# Patient Record
Sex: Female | Born: 1970
Health system: Southern US, Community
[De-identification: ages and names within clinical notes are randomized; demographics above are authoritative.]

## PROBLEM LIST (undated history)

## (undated) DIAGNOSIS — I10 Essential (primary) hypertension: Secondary | ICD-10-CM

## (undated) DIAGNOSIS — M549 Dorsalgia, unspecified: Secondary | ICD-10-CM

## (undated) DIAGNOSIS — E781 Pure hyperglyceridemia: Secondary | ICD-10-CM

## (undated) DIAGNOSIS — J45909 Unspecified asthma, uncomplicated: Secondary | ICD-10-CM

## (undated) HISTORY — PX: SPINAL CORD STIMULATOR IMPLANT: SHX2422

## (undated) HISTORY — PX: ABDOMINAL HYSTERECTOMY: SHX81

## (undated) HISTORY — PX: TMJ ARTHROPLASTY: SHX1066

---

## 1999-08-12 ENCOUNTER — Ambulatory Visit (HOSPITAL_COMMUNITY): Admission: RE | Admit: 1999-08-12 | Discharge: 1999-08-12 | Payer: Self-pay | Admitting: Orthopedic Surgery

## 1999-08-12 ENCOUNTER — Encounter: Payer: Self-pay | Admitting: Orthopedic Surgery

## 1999-09-06 ENCOUNTER — Ambulatory Visit (HOSPITAL_BASED_OUTPATIENT_CLINIC_OR_DEPARTMENT_OTHER): Admission: RE | Admit: 1999-09-06 | Discharge: 1999-09-06 | Payer: Self-pay | Admitting: Orthopedic Surgery

## 2018-07-09 ENCOUNTER — Emergency Department (HOSPITAL_BASED_OUTPATIENT_CLINIC_OR_DEPARTMENT_OTHER): Payer: Managed Care, Other (non HMO)

## 2018-07-09 ENCOUNTER — Other Ambulatory Visit: Payer: Self-pay

## 2018-07-09 ENCOUNTER — Emergency Department (HOSPITAL_BASED_OUTPATIENT_CLINIC_OR_DEPARTMENT_OTHER)
Admission: EM | Admit: 2018-07-09 | Discharge: 2018-07-09 | Disposition: A | Discharge status: Home / Self Care | Payer: Managed Care, Other (non HMO) | Attending: Emergency Medicine | Admitting: Emergency Medicine

## 2018-07-09 ENCOUNTER — Encounter (HOSPITAL_BASED_OUTPATIENT_CLINIC_OR_DEPARTMENT_OTHER): Payer: Self-pay | Admitting: *Deleted

## 2018-07-09 DIAGNOSIS — Z79899 Other long term (current) drug therapy: Secondary | ICD-10-CM | POA: Diagnosis not present

## 2018-07-09 DIAGNOSIS — N23 Unspecified renal colic: Secondary | ICD-10-CM | POA: Diagnosis not present

## 2018-07-09 DIAGNOSIS — J45909 Unspecified asthma, uncomplicated: Secondary | ICD-10-CM | POA: Diagnosis not present

## 2018-07-09 DIAGNOSIS — R1032 Left lower quadrant pain: Secondary | ICD-10-CM | POA: Diagnosis present

## 2018-07-09 DIAGNOSIS — I10 Essential (primary) hypertension: Secondary | ICD-10-CM | POA: Insufficient documentation

## 2018-07-09 HISTORY — DX: Dorsalgia, unspecified: M54.9

## 2018-07-09 HISTORY — DX: Essential (primary) hypertension: I10

## 2018-07-09 HISTORY — DX: Unspecified asthma, uncomplicated: J45.909

## 2018-07-09 HISTORY — DX: Pure hyperglyceridemia: E78.1

## 2018-07-09 LAB — COMPREHENSIVE METABOLIC PANEL
ALT: 52 U/L — AB (ref 0–44)
AST: 30 U/L (ref 15–41)
Albumin: 4.5 g/dL (ref 3.5–5.0)
Alkaline Phosphatase: 29 U/L — ABNORMAL LOW (ref 38–126)
Anion gap: 12 (ref 5–15)
BILIRUBIN TOTAL: 0.6 mg/dL (ref 0.3–1.2)
BUN: 16 mg/dL (ref 6–20)
CO2: 26 mmol/L (ref 22–32)
CREATININE: 0.89 mg/dL (ref 0.44–1.00)
Calcium: 9.7 mg/dL (ref 8.9–10.3)
Chloride: 99 mmol/L (ref 98–111)
GFR calc Af Amer: >60 mL/min (ref >60)
Glucose, Bld: 91 mg/dL (ref 70–99)
Potassium: 3.6 mmol/L (ref 3.5–5.1)
Sodium: 137 mmol/L (ref 135–145)
TOTAL PROTEIN: 7.7 g/dL (ref 6.5–8.1)

## 2018-07-09 LAB — URINALYSIS, ROUTINE W REFLEX MICROSCOPIC
Bilirubin Urine: NEGATIVE
Glucose, UA: NEGATIVE mg/dL
Hgb urine dipstick: NEGATIVE
Ketones, ur: NEGATIVE mg/dL
Leukocytes, UA: NEGATIVE
Nitrite: NEGATIVE
Protein, ur: NEGATIVE mg/dL
Specific Gravity, Urine: 1.01 (ref 1.005–1.030)
pH: 8 (ref 5.0–8.0)

## 2018-07-09 LAB — CBC WITH DIFFERENTIAL/PLATELET
Abs Immature Granulocytes: 0.02 10*3/uL (ref 0.00–0.07)
Basophils Absolute: 0 10*3/uL (ref 0.0–0.1)
Basophils Relative: 0 %
EOS ABS: 0.1 10*3/uL (ref 0.0–0.5)
Eosinophils Relative: 1 %
HEMATOCRIT: 40 % (ref 36.0–46.0)
Hemoglobin: 13.5 g/dL (ref 12.0–15.0)
IMMATURE GRANULOCYTES: 0 %
LYMPHS ABS: 2.6 10*3/uL (ref 0.7–4.0)
Lymphocytes Relative: 28 %
MCH: 30.2 pg (ref 26.0–34.0)
MCHC: 33.8 g/dL (ref 30.0–36.0)
MCV: 89.5 fL (ref 80.0–100.0)
MONOS PCT: 7 %
Monocytes Absolute: 0.6 10*3/uL (ref 0.1–1.0)
NEUTROS ABS: 6.2 10*3/uL (ref 1.7–7.7)
NEUTROS PCT: 64 %
Platelets: 340 10*3/uL (ref 150–400)
RBC: 4.47 MIL/uL (ref 3.87–5.11)
RDW: 12.3 % (ref 11.5–15.5)
WBC: 9.6 10*3/uL (ref 4.0–10.5)
nRBC: 0 % (ref 0.0–0.2)

## 2018-07-09 LAB — LIPASE, BLOOD: LIPASE: 29 U/L (ref 11–51)

## 2018-07-09 MED ORDER — TAMSULOSIN HCL 0.4 MG PO CAPS: 0.4000 mg | ORAL_CAPSULE | Freq: Every day | ORAL | 0 refills | Status: AC

## 2018-07-09 MED ORDER — KETOROLAC TROMETHAMINE 15 MG/ML IJ SOLN
15.0000 mg | Freq: Once | INTRAMUSCULAR | Status: AC
  Administered 2018-07-09: 15 mg via INTRAVENOUS
  Filled 2018-07-09: qty 1

## 2018-07-09 MED ORDER — NAPROXEN 500 MG PO TABS: 500.0000 mg | ORAL_TABLET | Freq: Two times a day (BID) | ORAL | 0 refills | Status: AC

## 2018-07-09 MED ORDER — ONDANSETRON 4 MG PO TBDP: 4.0000 mg | ORAL_TABLET | Freq: Three times a day (TID) | ORAL | 0 refills | Status: AC | PRN

## 2018-07-09 MED FILL — ONDANSETRON ODT 4 MG TABLET: 4 | 3 days supply | Qty: 10 | Fill #0

## 2018-07-09 MED FILL — TAMSULOSIN HCL 0.4 MG CAP: 0.4 | 7 days supply | Qty: 7 | Fill #0

## 2018-07-09 MED FILL — NAPROXEN 500 MG TABLET: 500 | 10 days supply | Qty: 20 | Fill #0

## 2018-07-09 NOTE — Discharge Instructions (Signed)
Please read and follow all provided instructions.  Your diagnoses today include:  1. Ureteral colic     Tests performed today include:  Urine test that no blood in your urine and no infection  CT scan which showed a 4.5 millimeter kidney on the left side side  Blood test that showed normal kidney function  Vital signs. See below for your results today.   Medications prescribed:   Flomax (tamsulosin) - relaxes smooth muscle to help kidney stones pass   Naproxen - anti-inflammatory pain medication  Do not exceed 500mg  naproxen every 12 hours, take with food  You have been prescribed an anti-inflammatory medication or NSAID. Take with food. Take smallest effective dose for the shortest duration needed for your pain. Stop taking if you experience stomach pain or vomiting.    Zofran (ondansetron) - for nausea and vomiting  Take any prescribed medications only as directed.  Home care instructions:  Follow any educational materials contained in this packet.  Please double your fluid intake for the next several days. Strain your urine and save any stones that may pass.   BE VERY CAREFUL not to take multiple medicines containing Tylenol (also called acetaminophen). Doing so can lead to an overdose which can damage your liver and cause liver failure and possibly death.   Follow-up instructions: Please follow-up with your urologist or the urologist referral (provided on front page) in the next 1 week for further evaluation of your symptoms.  Return instructions:  If you need to return to the Emergency Department, go to [facility] and not [facility]. The urologists are located at [facility] and can better care for you at this location.   Please return to the Emergency Department if you experience worsening symptoms.  Please return if you develop fever or uncontrolled pain or vomiting.  Please return if you have any other emergent concerns.  Additional  Information:  Your vital signs today were: BP (!) 134/100 (BP Location: Right Arm)    Pulse 94    Temp 98.7 F (37.1 C) (Oral)    Resp 16    Ht 5\' 5"  (1.651 m)    Wt 83.9 kg    SpO2 96%    BMI 30.79 kg/m  If your blood pressure (BP) was elevated above 135/85 this visit, please have this repeated by your doctor within one month. --------------

## 2018-07-09 NOTE — ED Triage Notes (Signed)
Back pain off and on x 2 days. She was seen 2 days ago and her urine was negative but she was started on Keflex. She had visible blood in her urine 4 days ago without pain.

## 2018-07-09 NOTE — ED Provider Notes (Signed)
[facility] EMERGENCY DEPARTMENT Provider Note   CSN: [id number] Arrival date & time: 07/09/18  1252     History   Chief Complaint No chief complaint on file.   HPI Susan Good is a 47 y.o. female.  Patient with history of ankylosing spondylitis on Enbrel, previous spinal cord stimulator implantation, on chronic morphine -- presents to the emergency department with left sided back pain, flank pain, and abdominal pain.  Symptoms started 3 to 4 days ago.  She saw her primary care physician who ordered a UA which was negative however she was given antibiotics to cover for clinical urinary tract infection.  Symptoms did seem to improve however last night they became much worse.  Pain is severe at its worst, waxing and waning.  It does not radiate.  Patient has had associated nausea and vomiting.  No constipation, diarrhea, hematochezia or melena.  No vaginal bleeding or discharge.  She denies dysuria, persistent hematuria, increased frequency urgency.  She states that she did see some blood in her urine 4 nights ago.  She has a history of diverticulitis however this feels different.  She also states that she has had several surgeries in the past for ovarian cysts.  She thinks that she had her appendix removed during 1 of the surgeries but is uncertain.  She has had a hysterectomy.  Home medications are not helping her pain.     Past Medical History:  Diagnosis Date  . Asthma   . Back pain   . High blood triglycerides   . Hypertension     There are no active problems to display for this patient.   Past Surgical History:  Procedure Laterality Date  . ABDOMINAL HYSTERECTOMY    . CESAREAN SECTION    . SPINAL CORD STIMULATOR IMPLANT    . TMJ ARTHROPLASTY       OB History   None      Home Medications    Prior to Admission medications   Medication Sig Start Date End Date Taking? Authorizing Provider  ALBUTEROL IN Inhale into the lungs.   Yes [provider]    co-enzyme Q-10 30 MG capsule Take 30 mg by mouth 3 (three) times daily.   Yes [provider]  cyclobenzaprine (FLEXERIL) 5 MG tablet Take 5 mg by mouth 3 (three) times daily as needed for muscle spasms.   Yes [provider]  Etanercept (ENBREL MINI) 50 MG/ML SOCT Inject into the skin.   Yes [provider]  fenofibrate 160 MG tablet Take 160 mg by mouth daily.   Yes [provider]  Fluticasone Furoate-Vilanterol (BREO ELLIPTA IN) Inhale into the lungs.   Yes [provider]  lisinopril-hydrochlorothiazide (PRINZIDE,ZESTORETIC) 20-12.5 MG tablet Take 1 tablet by mouth daily.   Yes [provider]  montelukast (SINGULAIR) 10 MG tablet Take 10 mg by mouth at bedtime.   Yes [provider]  morphine (MSIR) 15 MG tablet Take 15 mg by mouth every 4 (four) hours as needed for severe pain.   Yes [provider]  naloxone (NARCAN) nasal spray 4 mg/0.1 mL Place 1 spray into the nose.   Yes [provider]  omeprazole (PRILOSEC) 20 MG capsule Take 20 mg by mouth daily.   Yes [provider]  Specialty Vitamins Products (MAGNESIUM, AMINO ACID CHELATE,) 133 MG tablet Take 1 tablet by mouth 2 (two) times daily.   Yes [provider]  SUMAtriptan (IMITREX) 100 MG tablet Take 100 mg by  mouth every 2 (two) hours as needed for migraine. May repeat in 2 hours if headache persists or recurs.   Yes [provider]  verapamil (VERELAN PM) 180 MG 24 hr capsule Take 180 mg by mouth at bedtime.   Yes [provider]    Family History No family history on file.  Social History Social History   Tobacco Use  . Smoking status: Never Smoker  . Smokeless tobacco: Never Used  Substance Use Topics  . Alcohol use: Never    Frequency: Never  . Drug use: Never     Allergies   Baclofen; Codeine; Demerol [meperidine hcl]; Gabapentin; Ivp dye [iodinated diagnostic agents]; Lyrica [pregabalin];  Tizanidine hcl; and Tramadol   Review of Systems Review of Systems  Constitutional: Negative for fever.  HENT: Negative for rhinorrhea and sore throat.   Eyes: Negative for redness.  Respiratory: Negative for cough.   Cardiovascular: Negative for chest pain.  Gastrointestinal: Positive for abdominal pain. Negative for diarrhea, nausea and vomiting.  Genitourinary: Positive for flank pain. Negative for dysuria, hematuria, vaginal bleeding and vaginal discharge.  Musculoskeletal: Positive for back pain. Negative for myalgias.  Skin: Negative for rash.  Neurological: Negative for headaches.     Physical Exam Updated Vital Signs BP (!) 147/102   Pulse (!) 102   Temp 98.6 F (37 C) (Oral)   Resp 20   Ht 5\' 5"  (1.651 m)   Wt 83.9 kg   SpO2 100%   BMI 30.79 kg/m   Physical Exam  Constitutional: She appears well-developed and well-nourished.  HENT:  Head: Normocephalic and atraumatic.  Mouth/Throat: Oropharynx is clear and moist.  Eyes: Conjunctivae are normal. Right eye exhibits no discharge. Left eye exhibits no discharge.  Neck: Normal range of motion. Neck supple.  Cardiovascular: Normal rate, regular rhythm and normal heart sounds.  No murmur heard. Pulmonary/Chest: Effort normal and breath sounds normal.  Abdominal: Soft. There is tenderness (Mid left abdominal tenderness, mild to moderate). There is no rebound and no guarding.  Musculoskeletal: She exhibits tenderness. She exhibits no edema.  Tenderness to the left thoracic back and flank area.  Neurological: She is alert.  Skin: Skin is warm and dry.  Psychiatric: She has a normal mood and affect.  Nursing note and vitals reviewed.    ED Treatments / Results  Labs (all labs ordered are listed, but only abnormal results are displayed) Labs Reviewed  COMPREHENSIVE METABOLIC PANEL - Abnormal; Notable for the following components:      Result Value   ALT 52 (*)    Alkaline Phosphatase 29 (*)    All other  components within normal limits  URINALYSIS, ROUTINE W REFLEX MICROSCOPIC  CBC WITH DIFFERENTIAL/PLATELET  LIPASE, BLOOD    EKG None  Radiology Ct Renal Stone Study  Result Date: 07/09/2018 CLINICAL DATA:  Acute left flank pain, gross hematuria. EXAM: CT ABDOMEN AND PELVIS WITHOUT CONTRAST TECHNIQUE: Multidetector CT imaging of the abdomen and pelvis was performed following the standard protocol without IV contrast. COMPARISON:  None. FINDINGS: Lower chest: No acute abnormality. Hepatobiliary: Fatty infiltration of the liver is noted. No gallstones or biliary dilatation is noted. Pancreas: Unremarkable. No pancreatic ductal dilatation or surrounding inflammatory changes. Spleen: Normal in size without focal abnormality. Adrenals/Urinary Tract: Adrenal glands appear normal. Right kidney and ureter are unremarkable. Nonobstructive left renal calculus is noted. Mild left hydronephrosis is noted secondary to 5 mm calculus just distal to ureteropelvic junction. Urinary bladder is unremarkable. Stomach/Bowel: Stomach is within normal limits.  Appendix appears normal. No evidence of bowel wall thickening, distention, or inflammatory changes. Mild sigmoid diverticulosis is noted. Vascular/Lymphatic: No significant vascular findings are present. No enlarged abdominal or pelvic lymph nodes. Reproductive: Status post hysterectomy. No adnexal masses. Other: No abdominal wall hernia or abnormality. No abdominopelvic ascites. Musculoskeletal: No acute or significant osseous findings. IMPRESSION: Mild left hydronephrosis is noted secondary to 5 mm calculus just distal to ureteropelvic junction. Small nonobstructive left renal calculus is noted as well. Fatty infiltration of the liver. Sigmoid diverticulosis without inflammation. Electronically Signed   By: Lupita Raider, M.D.   On: 07/09/2018 14:42    Procedures Procedures (including critical care time)  Medications Ordered in ED Medications  ketorolac  (TORADOL) 15 MG/ML injection 15 mg (15 mg Intravenous Given 07/09/18 1459)     Initial Impression / Assessment and Plan / ED Course  I have reviewed the triage vital signs and the nursing notes.  Pertinent labs & imaging results that were available during my care of the patient were reviewed by me and considered in my medical decision making (see chart for details).     Patient seen and examined. Work-up initiated. Medications declined.   Vital signs reviewed and are as follows: BP (!) 147/102   Pulse (!) 102   Temp 98.6 F (37 C) (Oral)   Resp 20   Ht 5\' 5"  (1.651 m)   Wt 83.9 kg   SpO2 100%   BMI 30.79 kg/m   3:23 PM patient updated on results.  She is comfortable with the current time.  She has received IV Toradol.  Patient counseled on kidney stone treatment. Urged patient to strain urine and save any stones. Urged urology follow-up and return to [facility] with any complications. Counseled patient to maintain good fluid intake.   Counseled patient on use of Flomax and NSAIDs.   Patient counseled on use of narcotic pain medications. She will continue usual home meds. Urged not to drink alcohol, drive, or perform any other activities that requires focus while taking these medications. The patient verbalizes understanding and agrees with the plan.    Final Clinical Impressions(s) / ED Diagnoses   Final diagnoses:  Ureteral colic   Patient with left-sided renal colic.  Stone has not progressed very far as of yet.  Urine without signs of infection.  Normal kidney function.  Pain is controlled.  Home with continued conservative measures, her home chronic pain medication, and urology follow-up.  Return instructions as above.  ED Discharge Orders         Ordered    tamsulosin (FLOMAX) 0.4 MG CAPS capsule  Daily     07/09/18 1520    naproxen (NAPROSYN) 500 MG tablet  2 times daily     07/09/18 1520    ondansetron (ZOFRAN ODT) 4 MG disintegrating tablet  Every 8 hours PRN      07/09/18 1521           Renne Crigler, PA-C 07/09/18 1525    Alvira Monday, MD 07/10/18 2056

## 2019-09-11 IMAGING — CT CT RENAL STONE PROTOCOL
2 of 4 series · 16 of 46 positions shown, 18 images · non-contrast
Comparison: None.

CLINICAL DATA: Acute left flank pain, gross hematuria.

EXAM:
CT ABDOMEN AND PELVIS WITHOUT CONTRAST
TECHNIQUE: Multidetector CT imaging of the abdomen and pelvis was performed
following the standard protocol without IV contrast.

[Series 2: axial st · axial · 0.98mm/px · z∈[-451,+14]mm · 13 of 105 slices shown, 15 images]
[im 8/105  soft-tissue]
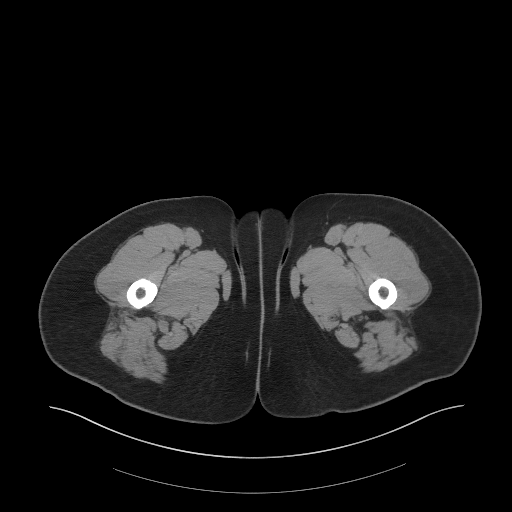
[im 8/105  bone]
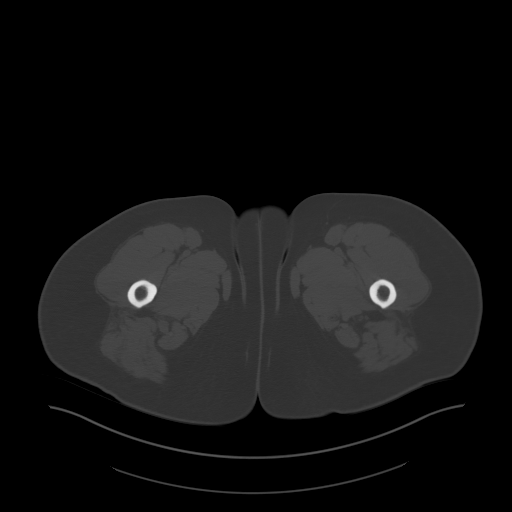
[im 16/105  soft-tissue]
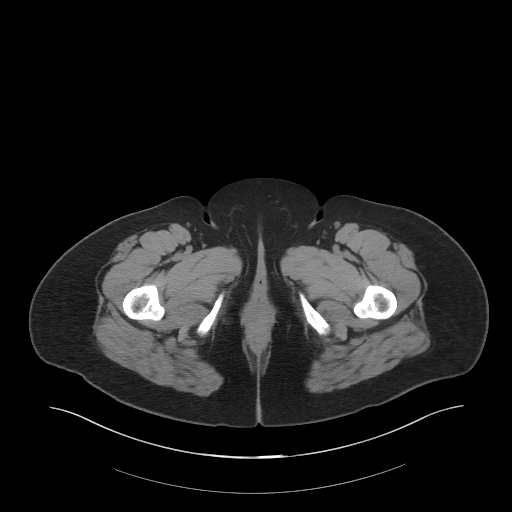
[im 24/105  soft-tissue]
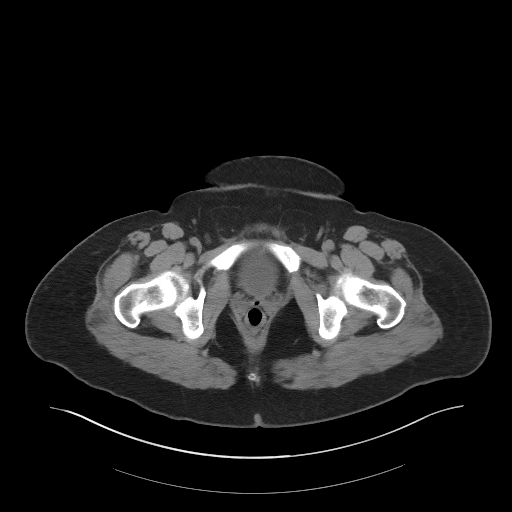
[im 31/105  soft-tissue]
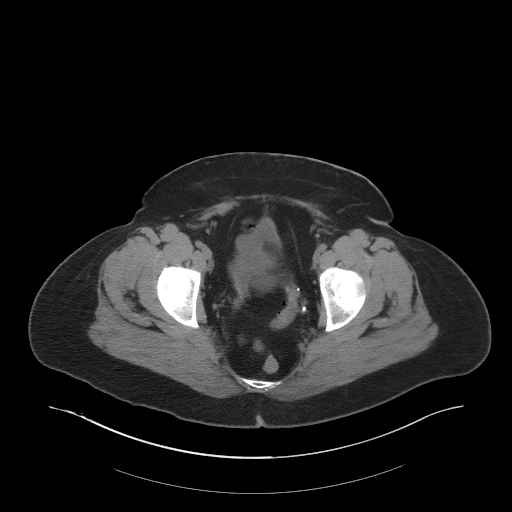
[im 39/105  soft-tissue]
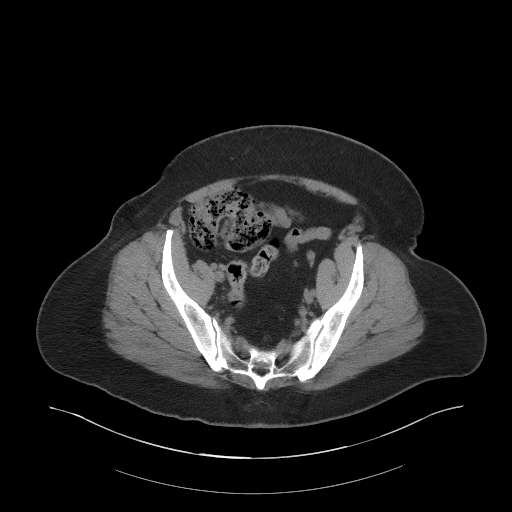
[im 47/105  soft-tissue]
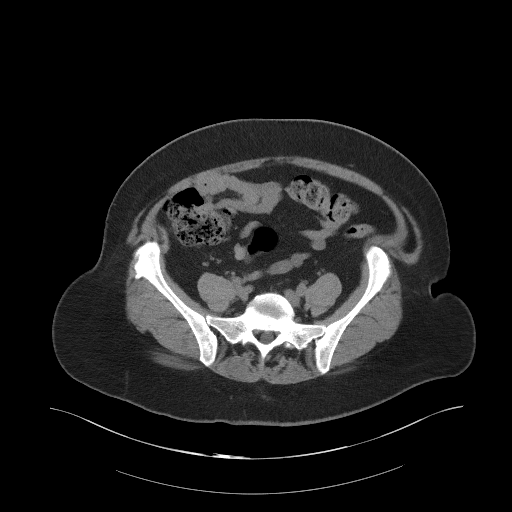
[im 54/105  soft-tissue]
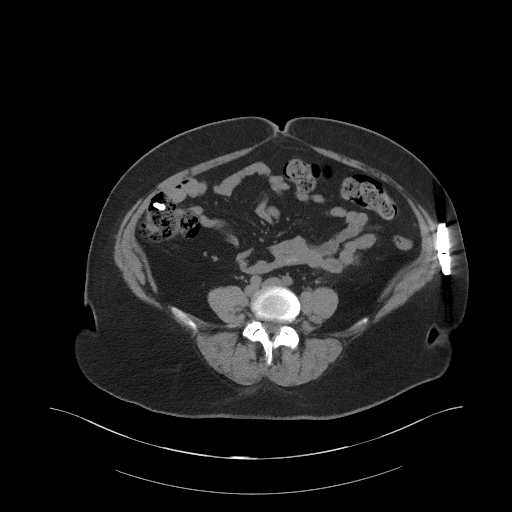
[im 62/105  soft-tissue]
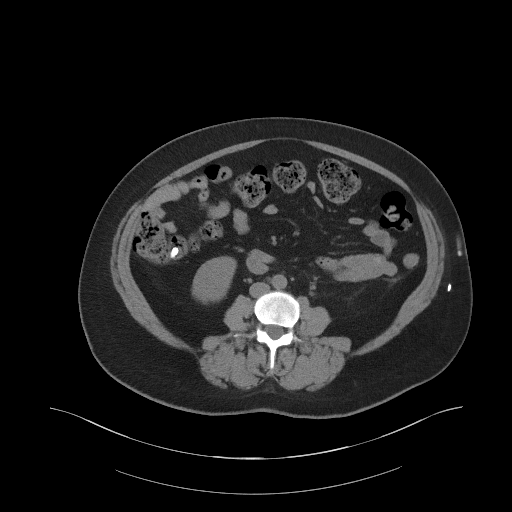
[im 70/105  soft-tissue]
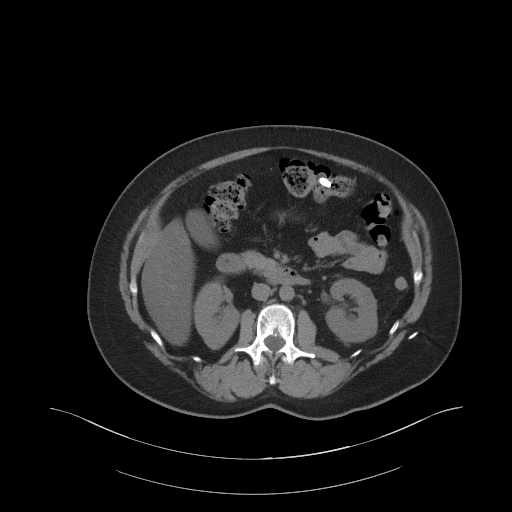
[im 70/105  bone]
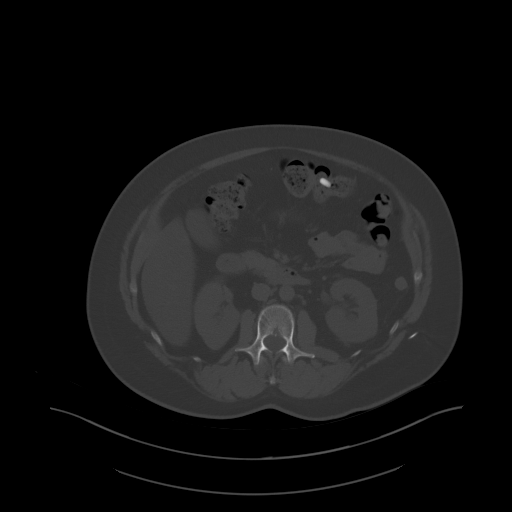
[im 78/105  soft-tissue]
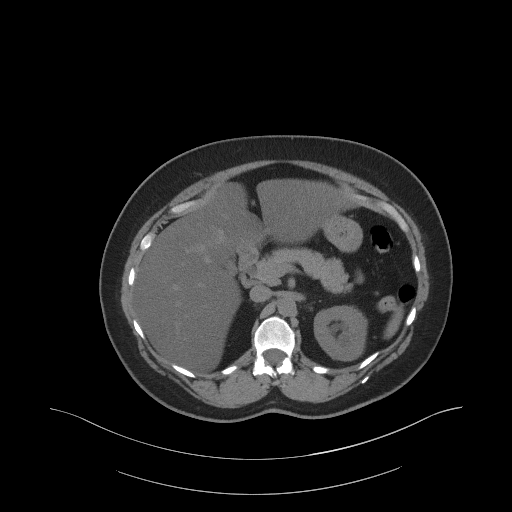
[im 85/105  soft-tissue]
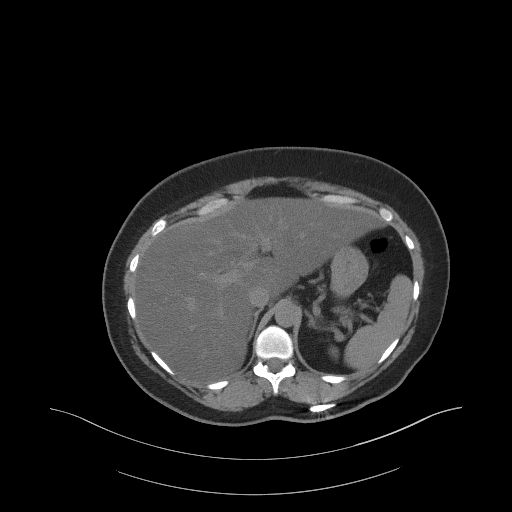
[im 93/105  soft-tissue]
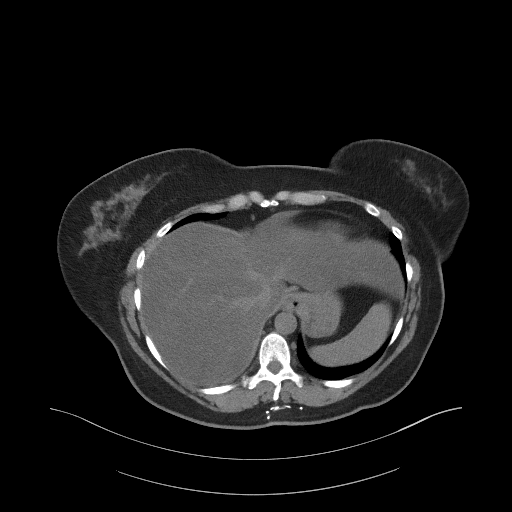
[im 101/105  soft-tissue]
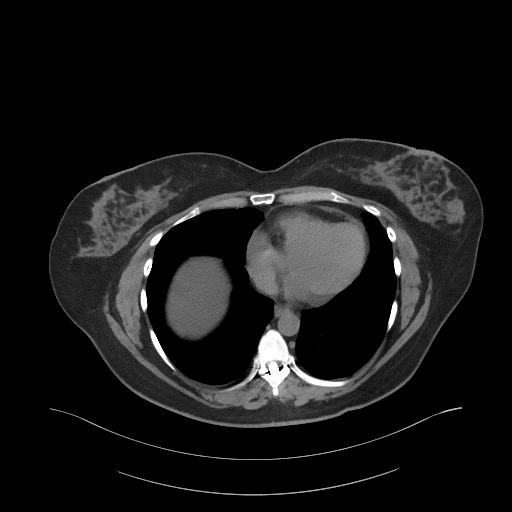

[Series 4: coronal st · coronal · 0.84mm/px · 3 of 108 slices shown]
[im 36/108  soft-tissue]
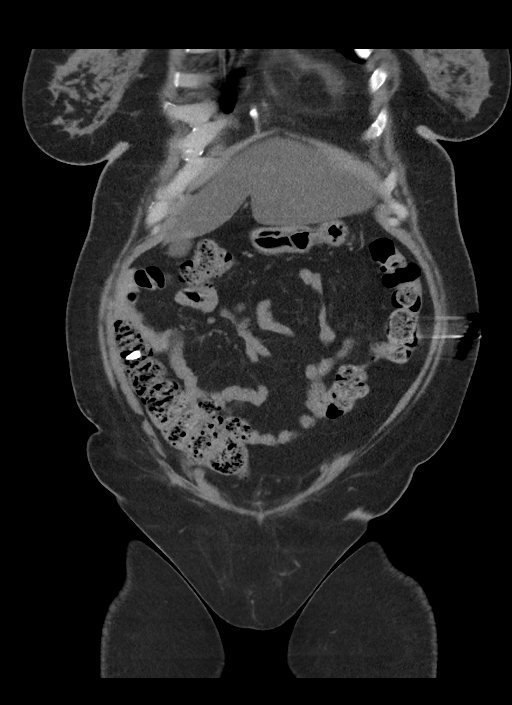
[im 48/108  soft-tissue]
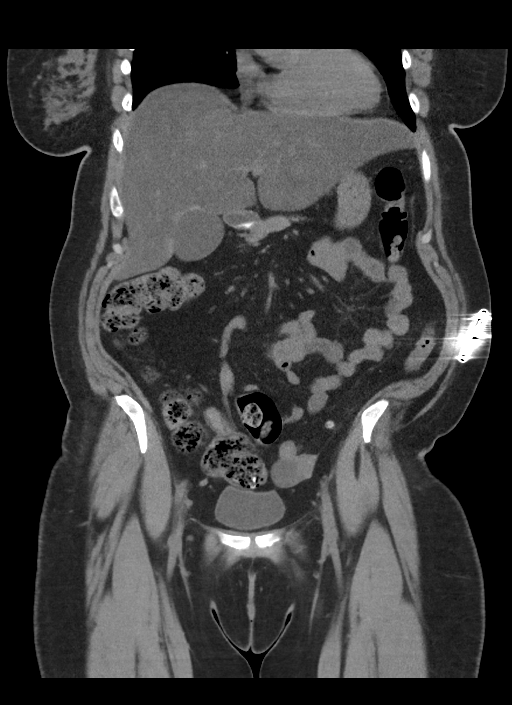
[im 60/108  soft-tissue]
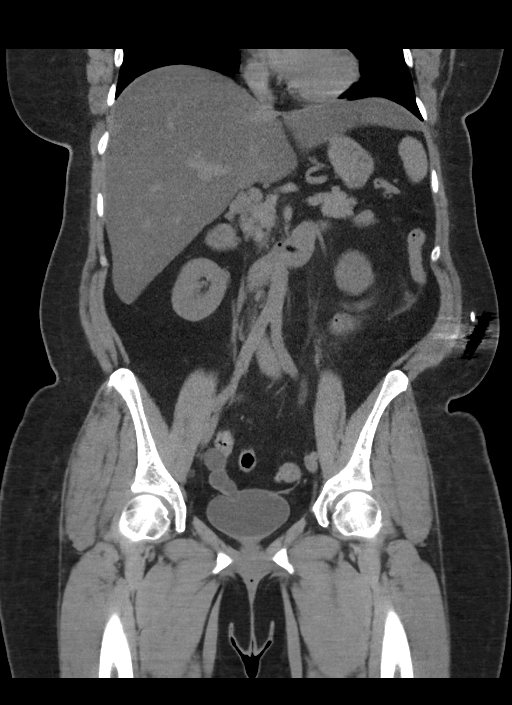

[16 of 46 positions shown; findings below may reference images not displayed]

FINDINGS: Lower chest: No acute abnormality.

Hepatobiliary: Fatty infiltration of the liver is noted. No
gallstones or biliary dilatation is noted.

Pancreas: Unremarkable. No pancreatic ductal dilatation or
surrounding inflammatory changes.

Spleen: Normal in size without focal abnormality.

Adrenals/Urinary Tract: Adrenal glands appear normal. Right kidney
and ureter are unremarkable. Nonobstructive left renal calculus is
noted. Mild left hydronephrosis is noted secondary to 5 mm calculus
just distal to ureteropelvic junction. Urinary bladder is
unremarkable.

Stomach/Bowel: Stomach is within normal limits. Appendix appears
normal. No evidence of bowel wall thickening, distention, or
inflammatory changes. Mild sigmoid diverticulosis is noted.

Vascular/Lymphatic: No significant vascular findings are present. No
enlarged abdominal or pelvic lymph nodes.

Reproductive: Status post hysterectomy. No adnexal masses.

Other: No abdominal wall hernia or abnormality. No abdominopelvic
ascites.

Musculoskeletal: No acute or significant osseous findings.
IMPRESSION: Mild left hydronephrosis is noted secondary to 5 mm calculus just
distal to ureteropelvic junction. Small nonobstructive left renal
calculus is noted as well.

Fatty infiltration of the liver.

Sigmoid diverticulosis without inflammation.

## 2023-03-23 DIAGNOSIS — S31109A Unspecified open wound of abdominal wall, unspecified quadrant without penetration into peritoneal cavity, initial encounter: Secondary | ICD-10-CM

## 2023-03-23 DIAGNOSIS — D649 Anemia, unspecified: Secondary | ICD-10-CM | POA: Diagnosis not present

## 2023-03-23 DIAGNOSIS — I10 Essential (primary) hypertension: Secondary | ICD-10-CM | POA: Diagnosis not present

## 2023-03-23 DIAGNOSIS — M545 Low back pain, unspecified: Secondary | ICD-10-CM | POA: Diagnosis not present

## 2023-03-23 DIAGNOSIS — E1169 Type 2 diabetes mellitus with other specified complication: Secondary | ICD-10-CM | POA: Diagnosis not present

## 2023-03-24 DIAGNOSIS — E1169 Type 2 diabetes mellitus with other specified complication: Secondary | ICD-10-CM | POA: Diagnosis not present

## 2023-03-24 DIAGNOSIS — D649 Anemia, unspecified: Secondary | ICD-10-CM | POA: Diagnosis not present

## 2023-03-24 DIAGNOSIS — M545 Low back pain, unspecified: Secondary | ICD-10-CM | POA: Diagnosis not present

## 2023-03-24 DIAGNOSIS — I10 Essential (primary) hypertension: Secondary | ICD-10-CM | POA: Diagnosis not present

## 2023-03-24 DIAGNOSIS — S31109A Unspecified open wound of abdominal wall, unspecified quadrant without penetration into peritoneal cavity, initial encounter: Secondary | ICD-10-CM

## 2023-03-25 DIAGNOSIS — D649 Anemia, unspecified: Secondary | ICD-10-CM | POA: Diagnosis not present

## 2023-03-25 DIAGNOSIS — M545 Low back pain, unspecified: Secondary | ICD-10-CM | POA: Diagnosis not present

## 2023-03-25 DIAGNOSIS — I10 Essential (primary) hypertension: Secondary | ICD-10-CM | POA: Diagnosis not present

## 2023-03-25 DIAGNOSIS — E1169 Type 2 diabetes mellitus with other specified complication: Secondary | ICD-10-CM | POA: Diagnosis not present

## 2023-03-26 DIAGNOSIS — D649 Anemia, unspecified: Secondary | ICD-10-CM | POA: Diagnosis not present

## 2023-03-26 DIAGNOSIS — M545 Low back pain, unspecified: Secondary | ICD-10-CM | POA: Diagnosis not present

## 2023-03-26 DIAGNOSIS — E1169 Type 2 diabetes mellitus with other specified complication: Secondary | ICD-10-CM | POA: Diagnosis not present

## 2023-03-26 DIAGNOSIS — S31109A Unspecified open wound of abdominal wall, unspecified quadrant without penetration into peritoneal cavity, initial encounter: Secondary | ICD-10-CM

## 2023-03-26 DIAGNOSIS — I10 Essential (primary) hypertension: Secondary | ICD-10-CM | POA: Diagnosis not present

## 2023-03-27 DIAGNOSIS — M545 Low back pain, unspecified: Secondary | ICD-10-CM | POA: Diagnosis not present

## 2023-03-27 DIAGNOSIS — I10 Essential (primary) hypertension: Secondary | ICD-10-CM | POA: Diagnosis not present

## 2023-03-27 DIAGNOSIS — E1169 Type 2 diabetes mellitus with other specified complication: Secondary | ICD-10-CM | POA: Diagnosis not present

## 2023-03-27 DIAGNOSIS — D649 Anemia, unspecified: Secondary | ICD-10-CM | POA: Diagnosis not present

## 2023-03-28 DIAGNOSIS — D649 Anemia, unspecified: Secondary | ICD-10-CM | POA: Diagnosis not present

## 2023-03-28 DIAGNOSIS — M545 Low back pain, unspecified: Secondary | ICD-10-CM | POA: Diagnosis not present

## 2023-03-28 DIAGNOSIS — E1169 Type 2 diabetes mellitus with other specified complication: Secondary | ICD-10-CM | POA: Diagnosis not present

## 2023-03-28 DIAGNOSIS — I10 Essential (primary) hypertension: Secondary | ICD-10-CM | POA: Diagnosis not present

## 2023-03-29 DIAGNOSIS — M545 Low back pain, unspecified: Secondary | ICD-10-CM | POA: Diagnosis not present

## 2023-03-29 DIAGNOSIS — E1169 Type 2 diabetes mellitus with other specified complication: Secondary | ICD-10-CM | POA: Diagnosis not present

## 2023-03-29 DIAGNOSIS — I10 Essential (primary) hypertension: Secondary | ICD-10-CM | POA: Diagnosis not present

## 2023-03-29 DIAGNOSIS — D649 Anemia, unspecified: Secondary | ICD-10-CM | POA: Diagnosis not present

## 2023-03-30 DIAGNOSIS — M545 Low back pain, unspecified: Secondary | ICD-10-CM | POA: Diagnosis not present

## 2023-03-30 DIAGNOSIS — S31109A Unspecified open wound of abdominal wall, unspecified quadrant without penetration into peritoneal cavity, initial encounter: Secondary | ICD-10-CM

## 2023-03-30 DIAGNOSIS — I10 Essential (primary) hypertension: Secondary | ICD-10-CM | POA: Diagnosis not present

## 2023-03-30 DIAGNOSIS — D649 Anemia, unspecified: Secondary | ICD-10-CM | POA: Diagnosis not present

## 2023-03-30 DIAGNOSIS — E1169 Type 2 diabetes mellitus with other specified complication: Secondary | ICD-10-CM | POA: Diagnosis not present

## 2023-03-31 DIAGNOSIS — D649 Anemia, unspecified: Secondary | ICD-10-CM | POA: Diagnosis not present

## 2023-03-31 DIAGNOSIS — I10 Essential (primary) hypertension: Secondary | ICD-10-CM | POA: Diagnosis not present

## 2023-03-31 DIAGNOSIS — E1169 Type 2 diabetes mellitus with other specified complication: Secondary | ICD-10-CM | POA: Diagnosis not present

## 2023-03-31 DIAGNOSIS — M545 Low back pain, unspecified: Secondary | ICD-10-CM | POA: Diagnosis not present

## 2023-04-01 DIAGNOSIS — M545 Low back pain, unspecified: Secondary | ICD-10-CM | POA: Diagnosis not present

## 2023-04-01 DIAGNOSIS — E1169 Type 2 diabetes mellitus with other specified complication: Secondary | ICD-10-CM | POA: Diagnosis not present

## 2023-04-01 DIAGNOSIS — D649 Anemia, unspecified: Secondary | ICD-10-CM | POA: Diagnosis not present

## 2023-04-01 DIAGNOSIS — I10 Essential (primary) hypertension: Secondary | ICD-10-CM | POA: Diagnosis not present

## 2023-04-02 DIAGNOSIS — M545 Low back pain, unspecified: Secondary | ICD-10-CM | POA: Diagnosis not present

## 2023-04-02 DIAGNOSIS — S31109A Unspecified open wound of abdominal wall, unspecified quadrant without penetration into peritoneal cavity, initial encounter: Secondary | ICD-10-CM

## 2023-04-02 DIAGNOSIS — E1169 Type 2 diabetes mellitus with other specified complication: Secondary | ICD-10-CM | POA: Diagnosis not present

## 2023-04-02 DIAGNOSIS — D649 Anemia, unspecified: Secondary | ICD-10-CM | POA: Diagnosis not present

## 2023-04-02 DIAGNOSIS — I10 Essential (primary) hypertension: Secondary | ICD-10-CM | POA: Diagnosis not present

## 2023-04-03 DIAGNOSIS — E1169 Type 2 diabetes mellitus with other specified complication: Secondary | ICD-10-CM | POA: Diagnosis not present

## 2023-04-03 DIAGNOSIS — M545 Low back pain, unspecified: Secondary | ICD-10-CM | POA: Diagnosis not present

## 2023-04-03 DIAGNOSIS — D649 Anemia, unspecified: Secondary | ICD-10-CM | POA: Diagnosis not present

## 2023-04-03 DIAGNOSIS — I10 Essential (primary) hypertension: Secondary | ICD-10-CM | POA: Diagnosis not present

## 2023-04-04 DIAGNOSIS — E1169 Type 2 diabetes mellitus with other specified complication: Secondary | ICD-10-CM | POA: Diagnosis not present

## 2023-04-04 DIAGNOSIS — I10 Essential (primary) hypertension: Secondary | ICD-10-CM | POA: Diagnosis not present

## 2023-04-04 DIAGNOSIS — D649 Anemia, unspecified: Secondary | ICD-10-CM | POA: Diagnosis not present

## 2023-04-04 DIAGNOSIS — M545 Low back pain, unspecified: Secondary | ICD-10-CM | POA: Diagnosis not present

## 2023-04-05 DIAGNOSIS — D649 Anemia, unspecified: Secondary | ICD-10-CM | POA: Diagnosis not present

## 2023-04-05 DIAGNOSIS — I10 Essential (primary) hypertension: Secondary | ICD-10-CM | POA: Diagnosis not present

## 2023-04-05 DIAGNOSIS — M545 Low back pain, unspecified: Secondary | ICD-10-CM | POA: Diagnosis not present

## 2023-04-05 DIAGNOSIS — E1169 Type 2 diabetes mellitus with other specified complication: Secondary | ICD-10-CM | POA: Diagnosis not present

## 2023-04-06 DIAGNOSIS — M545 Low back pain, unspecified: Secondary | ICD-10-CM | POA: Diagnosis not present

## 2023-04-06 DIAGNOSIS — D649 Anemia, unspecified: Secondary | ICD-10-CM | POA: Diagnosis not present

## 2023-04-06 DIAGNOSIS — E1169 Type 2 diabetes mellitus with other specified complication: Secondary | ICD-10-CM | POA: Diagnosis not present

## 2023-04-06 DIAGNOSIS — I10 Essential (primary) hypertension: Secondary | ICD-10-CM | POA: Diagnosis not present
